# Patient Record
Sex: Female | Born: 1935 | Race: Black or African American | Hispanic: No
Health system: Southern US, Community
[De-identification: ages and names within clinical notes are randomized; demographics above are authoritative.]

## PROBLEM LIST (undated history)

## (undated) DIAGNOSIS — R55 Syncope and collapse: Secondary | ICD-10-CM

## (undated) DIAGNOSIS — Z8673 Personal history of transient ischemic attack (TIA), and cerebral infarction without residual deficits: Secondary | ICD-10-CM

## (undated) DIAGNOSIS — I10 Essential (primary) hypertension: Secondary | ICD-10-CM

## (undated) DIAGNOSIS — F0390 Unspecified dementia without behavioral disturbance: Secondary | ICD-10-CM

## (undated) DIAGNOSIS — E785 Hyperlipidemia, unspecified: Secondary | ICD-10-CM

## (undated) HISTORY — DX: Essential (primary) hypertension: I10

## (undated) HISTORY — PX: KNEE SURGERY: SHX244

## (undated) HISTORY — DX: Personal history of transient ischemic attack (TIA), and cerebral infarction without residual deficits: Z86.73

## (undated) HISTORY — PX: VAGINAL HYSTERECTOMY: SUR661

## (undated) HISTORY — DX: Syncope and collapse: R55

## (undated) HISTORY — PX: HALLUX VALGUS CORRECTION: SUR315

## (undated) HISTORY — DX: Unspecified dementia, unspecified severity, without behavioral disturbance, psychotic disturbance, mood disturbance, and anxiety: F03.90

## (undated) HISTORY — DX: Hyperlipidemia, unspecified: E78.5

---

## 2005-11-01 ENCOUNTER — Other Ambulatory Visit: Payer: Self-pay

## 2005-11-01 ENCOUNTER — Emergency Department: Payer: Self-pay | Admitting: Unknown Physician Specialty

## 2007-01-14 ENCOUNTER — Ambulatory Visit: Payer: Self-pay | Admitting: Internal Medicine

## 2007-04-13 ENCOUNTER — Other Ambulatory Visit: Payer: Self-pay

## 2007-04-13 ENCOUNTER — Inpatient Hospital Stay: Payer: Self-pay | Admitting: Internal Medicine

## 2007-09-16 ENCOUNTER — Ambulatory Visit: Payer: Self-pay | Admitting: Internal Medicine

## 2007-10-18 ENCOUNTER — Emergency Department: Payer: Self-pay | Admitting: Emergency Medicine

## 2008-02-26 ENCOUNTER — Ambulatory Visit: Payer: Self-pay | Admitting: Internal Medicine

## 2009-02-20 ENCOUNTER — Inpatient Hospital Stay: Payer: Self-pay | Admitting: Internal Medicine

## 2011-07-20 ENCOUNTER — Ambulatory Visit: Payer: Self-pay | Admitting: Podiatry

## 2011-07-27 ENCOUNTER — Ambulatory Visit: Payer: Self-pay | Admitting: Podiatry

## 2011-09-10 ENCOUNTER — Ambulatory Visit: Payer: Self-pay

## 2011-12-28 ENCOUNTER — Inpatient Hospital Stay: Payer: Self-pay | Admitting: Psychiatry

## 2011-12-28 LAB — DRUG SCREEN, URINE
Barbiturates, Ur Screen: NEGATIVE (ref ?–200)
Benzodiazepine, Ur Scrn: NEGATIVE (ref ?–200)
Cannabinoid 50 Ng, Ur ~~LOC~~: NEGATIVE (ref ?–50)
Cocaine Metabolite,Ur ~~LOC~~: NEGATIVE (ref ?–300)
Methadone, Ur Screen: NEGATIVE (ref ?–300)
Tricyclic, Ur Screen: NEGATIVE (ref ?–1000)

## 2011-12-28 LAB — COMPREHENSIVE METABOLIC PANEL
Albumin: 3.9 g/dL (ref 3.4–5.0)
Alkaline Phosphatase: 64 U/L (ref 50–136)
BUN: 28 mg/dL — ABNORMAL HIGH (ref 7–18)
Bilirubin,Total: 0.5 mg/dL (ref 0.2–1.0)
EGFR (African American): >60
Potassium: 4 mmol/L (ref 3.5–5.1)
SGPT (ALT): 21 U/L
Sodium: 143 mmol/L (ref 136–145)
Total Protein: 8.1 g/dL (ref 6.4–8.2)

## 2011-12-28 LAB — URINALYSIS, COMPLETE
Bacteria: NONE SEEN
Glucose,UR: NEGATIVE mg/dL (ref 0–75)
Leukocyte Esterase: NEGATIVE
Nitrite: NEGATIVE
Ph: 6 (ref 4.5–8.0)
Protein: NEGATIVE
Specific Gravity: 1.003 (ref 1.003–1.030)

## 2011-12-28 LAB — CBC
HCT: 40.1 % (ref 35.0–47.0)
HGB: 12.9 g/dL (ref 12.0–16.0)
MCH: 26.6 pg (ref 26.0–34.0)
MCHC: 32.3 g/dL (ref 32.0–36.0)
MCV: 82 fL (ref 80–100)
RDW: 14.4 % (ref 11.5–14.5)

## 2011-12-28 LAB — TSH: Thyroid Stimulating Horm: 1.52 u[IU]/mL

## 2012-10-18 ENCOUNTER — Observation Stay: Payer: Self-pay | Admitting: Internal Medicine

## 2012-10-18 LAB — CBC
HGB: 10.7 g/dL — ABNORMAL LOW (ref 12.0–16.0)
MCHC: 32.1 g/dL (ref 32.0–36.0)
Platelet: 109 10*3/uL — ABNORMAL LOW (ref 150–440)
RBC: 4.08 10*6/uL (ref 3.80–5.20)
RDW: 14.9 % — ABNORMAL HIGH (ref 11.5–14.5)

## 2012-10-18 LAB — URINALYSIS, COMPLETE
Bacteria: NONE SEEN
Bilirubin,UR: NEGATIVE
Glucose,UR: NEGATIVE mg/dL (ref 0–75)
Ketone: NEGATIVE
RBC,UR: 1 /HPF (ref 0–5)
Specific Gravity: 1.01 (ref 1.003–1.030)
Squamous Epithelial: 1
WBC UR: <1 /HPF (ref 0–5)

## 2012-10-18 LAB — COMPREHENSIVE METABOLIC PANEL
Alkaline Phosphatase: 70 U/L (ref 50–136)
Calcium, Total: 8.5 mg/dL (ref 8.5–10.1)
Co2: 28 mmol/L (ref 21–32)
EGFR (Non-African Amer.): 44 — ABNORMAL LOW
Osmolality: 289 (ref 275–301)
SGPT (ALT): 20 U/L (ref 12–78)

## 2012-10-18 LAB — CK TOTAL AND CKMB (NOT AT ARMC): CK-MB: 0.8 ng/mL (ref 0.5–3.6)

## 2012-10-18 LAB — TROPONIN I: Troponin-I: <0.02 ng/mL

## 2012-10-19 DIAGNOSIS — I10 Essential (primary) hypertension: Secondary | ICD-10-CM

## 2012-10-19 DIAGNOSIS — I369 Nonrheumatic tricuspid valve disorder, unspecified: Secondary | ICD-10-CM

## 2012-10-19 DIAGNOSIS — R55 Syncope and collapse: Secondary | ICD-10-CM

## 2012-10-19 LAB — CK TOTAL AND CKMB (NOT AT ARMC)
CK, Total: 47 U/L (ref 21–215)
CK, Total: 49 U/L (ref 21–215)
CK-MB: 1 ng/mL (ref 0.5–3.6)
CK-MB: 1 ng/mL (ref 0.5–3.6)

## 2012-10-19 LAB — CBC WITH DIFFERENTIAL/PLATELET
Basophil %: 0.8 %
Eosinophil #: 0.1 10*3/uL (ref 0.0–0.7)
Eosinophil %: 1.7 %
HGB: 10.7 g/dL — ABNORMAL LOW (ref 12.0–16.0)
Lymphocyte %: 49.2 %
MCHC: 31.6 g/dL — ABNORMAL LOW (ref 32.0–36.0)
Monocyte #: 0.3 x10 3/mm (ref 0.2–0.9)
Monocyte %: 8.4 %
Neutrophil #: 1.6 10*3/uL (ref 1.4–6.5)
Neutrophil %: 39.9 %
Platelet: 102 10*3/uL — ABNORMAL LOW (ref 150–440)
RBC: 4.1 10*6/uL (ref 3.80–5.20)

## 2012-10-19 LAB — COMPREHENSIVE METABOLIC PANEL
Albumin: 3.2 g/dL — ABNORMAL LOW (ref 3.4–5.0)
Alkaline Phosphatase: 63 U/L (ref 50–136)
Anion Gap: 6 — ABNORMAL LOW (ref 7–16)
Calcium, Total: 8.4 mg/dL — ABNORMAL LOW (ref 8.5–10.1)
Co2: 28 mmol/L (ref 21–32)
Creatinine: 1.02 mg/dL (ref 0.60–1.30)
EGFR (African American): 58 — ABNORMAL LOW
EGFR (Non-African Amer.): 50 — ABNORMAL LOW
Glucose: 72 mg/dL (ref 65–99)
Osmolality: 292 (ref 275–301)
SGOT(AST): 20 U/L (ref 15–37)
Sodium: 144 mmol/L (ref 136–145)
Total Protein: 6.4 g/dL (ref 6.4–8.2)

## 2012-10-19 LAB — TROPONIN I: Troponin-I: <0.02 ng/mL

## 2012-10-19 LAB — LACTATE DEHYDROGENASE: LDH: 168 U/L (ref 81–246)

## 2012-10-20 ENCOUNTER — Encounter: Payer: Self-pay | Admitting: *Deleted

## 2012-10-27 ENCOUNTER — Telehealth: Payer: Self-pay | Admitting: *Deleted

## 2012-10-27 ENCOUNTER — Encounter: Payer: Self-pay | Admitting: *Deleted

## 2012-10-27 NOTE — Telephone Encounter (Signed)
lmovm about upcoming appointment next Monday post hospital discharge. Mylo Red RN

## 2012-11-03 ENCOUNTER — Encounter: Payer: Self-pay | Admitting: Nurse Practitioner

## 2012-11-03 ENCOUNTER — Encounter: Payer: PRIVATE HEALTH INSURANCE | Admitting: Nurse Practitioner

## 2012-11-03 ENCOUNTER — Encounter: Payer: PRIVATE HEALTH INSURANCE | Admitting: Cardiovascular Disease

## 2012-11-07 ENCOUNTER — Ambulatory Visit: Payer: Self-pay | Admitting: Family Medicine

## 2013-07-08 ENCOUNTER — Emergency Department: Payer: Self-pay | Admitting: Emergency Medicine

## 2013-07-08 LAB — URINALYSIS, COMPLETE
Bilirubin,UR: NEGATIVE
Glucose,UR: NEGATIVE mg/dL (ref 0–75)
Leukocyte Esterase: NEGATIVE
Protein: NEGATIVE
RBC,UR: 3 /HPF (ref 0–5)
Squamous Epithelial: NONE SEEN
WBC UR: 1 /HPF (ref 0–5)

## 2013-07-08 LAB — TROPONIN I: Troponin-I: <0.02 ng/mL

## 2013-07-08 LAB — CBC
HCT: 36.9 % (ref 35.0–47.0)
HGB: 12.1 g/dL (ref 12.0–16.0)
MCH: 26.4 pg (ref 26.0–34.0)
MCHC: 32.9 g/dL (ref 32.0–36.0)
Platelet: 121 10*3/uL — ABNORMAL LOW (ref 150–440)
RBC: 4.59 10*6/uL (ref 3.80–5.20)
RDW: 14.8 % — ABNORMAL HIGH (ref 11.5–14.5)

## 2013-07-08 LAB — BASIC METABOLIC PANEL
Anion Gap: 6 — ABNORMAL LOW (ref 7–16)
Calcium, Total: 8.7 mg/dL (ref 8.5–10.1)
Co2: 27 mmol/L (ref 21–32)
EGFR (African American): >60
EGFR (Non-African Amer.): 55 — ABNORMAL LOW
Osmolality: 283 (ref 275–301)
Sodium: 142 mmol/L (ref 136–145)

## 2013-08-07 ENCOUNTER — Ambulatory Visit: Payer: Self-pay | Admitting: Family Medicine

## 2013-09-04 ENCOUNTER — Emergency Department: Payer: Self-pay | Admitting: Emergency Medicine

## 2013-09-04 LAB — COMPREHENSIVE METABOLIC PANEL
Alkaline Phosphatase: 90 U/L (ref 50–136)
Anion Gap: 4 — ABNORMAL LOW (ref 7–16)
BUN: 28 mg/dL — ABNORMAL HIGH (ref 7–18)
Chloride: 110 mmol/L — ABNORMAL HIGH (ref 98–107)
Co2: 26 mmol/L (ref 21–32)
Creatinine: 1.12 mg/dL (ref 0.60–1.30)
EGFR (Non-African Amer.): 44 — ABNORMAL LOW
Glucose: 94 mg/dL (ref 65–99)
Osmolality: 285 (ref 275–301)
SGOT(AST): 34 U/L (ref 15–37)
SGPT (ALT): 33 U/L (ref 12–78)
Sodium: 140 mmol/L (ref 136–145)
Total Protein: 6.6 g/dL (ref 6.4–8.2)

## 2013-09-04 LAB — DRUG SCREEN, URINE
Barbiturates, Ur Screen: NEGATIVE (ref ?–200)
Benzodiazepine, Ur Scrn: NEGATIVE (ref ?–200)
Cocaine Metabolite,Ur ~~LOC~~: NEGATIVE (ref ?–300)
MDMA (Ecstasy)Ur Screen: NEGATIVE (ref ?–500)
Opiate, Ur Screen: NEGATIVE (ref ?–300)
Phencyclidine (PCP) Ur S: NEGATIVE (ref ?–25)
Tricyclic, Ur Screen: NEGATIVE (ref ?–1000)

## 2013-09-04 LAB — URINALYSIS, COMPLETE
Glucose,UR: NEGATIVE mg/dL (ref 0–75)
Ketone: NEGATIVE
Nitrite: NEGATIVE
Ph: 7 (ref 4.5–8.0)

## 2013-09-04 LAB — CBC
HCT: 32.9 % — ABNORMAL LOW (ref 35.0–47.0)
HGB: 10.9 g/dL — ABNORMAL LOW (ref 12.0–16.0)
MCH: 26.8 pg (ref 26.0–34.0)
MCHC: 33 g/dL (ref 32.0–36.0)
MCV: 81 fL (ref 80–100)
Platelet: 128 10*3/uL — ABNORMAL LOW (ref 150–440)
RDW: 15.8 % — ABNORMAL HIGH (ref 11.5–14.5)
WBC: 3.7 10*3/uL (ref 3.6–11.0)

## 2013-09-04 LAB — ETHANOL
Ethanol %: <0.003 % (ref 0.000–0.080)
Ethanol: <3 mg/dL

## 2013-09-24 LAB — DRUG SCREEN, URINE
Cannabinoid 50 Ng, Ur ~~LOC~~: NEGATIVE (ref ?–50)
MDMA (Ecstasy)Ur Screen: NEGATIVE (ref ?–500)
Methadone, Ur Screen: NEGATIVE (ref ?–300)
Opiate, Ur Screen: NEGATIVE (ref ?–300)

## 2013-09-24 LAB — URINALYSIS, COMPLETE
Ketone: NEGATIVE
Nitrite: NEGATIVE
Ph: 5 (ref 4.5–8.0)
Protein: 30
RBC,UR: 264 /HPF (ref 0–5)
Specific Gravity: 1.02 (ref 1.003–1.030)
Squamous Epithelial: 1
Transitional Epi: <1
WBC UR: 562 /HPF (ref 0–5)

## 2013-09-24 LAB — COMPREHENSIVE METABOLIC PANEL
Albumin: 3.3 g/dL — ABNORMAL LOW (ref 3.4–5.0)
Alkaline Phosphatase: 91 U/L (ref 50–136)
Bilirubin,Total: 0.4 mg/dL (ref 0.2–1.0)
Calcium, Total: 8.4 mg/dL — ABNORMAL LOW (ref 8.5–10.1)
Co2: 26 mmol/L (ref 21–32)
Creatinine: 1.32 mg/dL — ABNORMAL HIGH (ref 0.60–1.30)
EGFR (African American): 42 — ABNORMAL LOW
EGFR (Non-African Amer.): 36 — ABNORMAL LOW
Glucose: 112 mg/dL — ABNORMAL HIGH (ref 65–99)
Potassium: 4.1 mmol/L (ref 3.5–5.1)
SGOT(AST): 23 U/L (ref 15–37)
SGPT (ALT): 21 U/L (ref 12–78)
Sodium: 141 mmol/L (ref 136–145)

## 2013-09-24 LAB — CBC
Platelet: 142 10*3/uL — ABNORMAL LOW (ref 150–440)
RBC: 3.86 10*6/uL (ref 3.80–5.20)
RDW: 15.3 % — ABNORMAL HIGH (ref 11.5–14.5)
WBC: 4.1 10*3/uL (ref 3.6–11.0)

## 2013-09-24 LAB — ETHANOL
Ethanol %: <0.003 % (ref 0.000–0.080)
Ethanol: <3 mg/dL

## 2013-09-24 LAB — SALICYLATE LEVEL: Salicylates, Serum: <1.7 mg/dL

## 2013-09-25 ENCOUNTER — Ambulatory Visit: Payer: Self-pay | Admitting: Hospice and Palliative Medicine

## 2013-09-25 ENCOUNTER — Inpatient Hospital Stay: Payer: Self-pay | Admitting: Internal Medicine

## 2013-09-27 LAB — BASIC METABOLIC PANEL
Anion Gap: 4 — ABNORMAL LOW (ref 7–16)
BUN: 24 mg/dL — ABNORMAL HIGH (ref 7–18)
Calcium, Total: 8.3 mg/dL — ABNORMAL LOW (ref 8.5–10.1)
Co2: 27 mmol/L (ref 21–32)
Glucose: 88 mg/dL (ref 65–99)
Osmolality: 287 (ref 275–301)
Sodium: 142 mmol/L (ref 136–145)

## 2013-10-05 ENCOUNTER — Ambulatory Visit: Payer: Self-pay | Admitting: Hospice and Palliative Medicine

## 2013-11-12 ENCOUNTER — Emergency Department: Payer: Self-pay | Admitting: Emergency Medicine

## 2013-11-12 LAB — CBC WITH DIFFERENTIAL/PLATELET
BASOS ABS: 0 10*3/uL (ref 0.0–0.1)
Basophil %: 0.7 %
EOS ABS: 0 10*3/uL (ref 0.0–0.7)
Eosinophil %: 0.3 %
HCT: 34.4 % — ABNORMAL LOW (ref 35.0–47.0)
HGB: 11.2 g/dL — AB (ref 12.0–16.0)
LYMPHS PCT: 15.9 %
Lymphocyte #: 0.9 10*3/uL — ABNORMAL LOW (ref 1.0–3.6)
MCH: 26.4 pg (ref 26.0–34.0)
MCHC: 32.4 g/dL (ref 32.0–36.0)
MCV: 81 fL (ref 80–100)
MONO ABS: 0.5 x10 3/mm (ref 0.2–0.9)
Monocyte %: 8.2 %
NEUTROS ABS: 4.2 10*3/uL (ref 1.4–6.5)
Neutrophil %: 74.9 %
Platelet: 131 10*3/uL — ABNORMAL LOW (ref 150–440)
RBC: 4.24 10*6/uL (ref 3.80–5.20)
RDW: 15.2 % — ABNORMAL HIGH (ref 11.5–14.5)
WBC: 5.6 10*3/uL (ref 3.6–11.0)

## 2013-11-12 LAB — URINALYSIS, COMPLETE
BACTERIA: NONE SEEN
BILIRUBIN, UR: NEGATIVE
Blood: NEGATIVE
Glucose,UR: NEGATIVE mg/dL (ref 0–75)
Hyaline Cast: 3
Ketone: NEGATIVE
Leukocyte Esterase: NEGATIVE
Nitrite: NEGATIVE
PH: 5 (ref 4.5–8.0)
PROTEIN: NEGATIVE
Specific Gravity: 1.026 (ref 1.003–1.030)
WBC UR: 1 /HPF (ref 0–5)

## 2013-11-12 LAB — BASIC METABOLIC PANEL
ANION GAP: 5 — AB (ref 7–16)
BUN: 32 mg/dL — AB (ref 7–18)
CALCIUM: 8.9 mg/dL (ref 8.5–10.1)
CO2: 24 mmol/L (ref 21–32)
CREATININE: 1.18 mg/dL (ref 0.60–1.30)
Chloride: 109 mmol/L — ABNORMAL HIGH (ref 98–107)
EGFR (African American): 48 — ABNORMAL LOW
GFR CALC NON AF AMER: 41 — AB
GLUCOSE: 98 mg/dL (ref 65–99)
OSMOLALITY: 283 (ref 275–301)
Potassium: 4.1 mmol/L (ref 3.5–5.1)
Sodium: 138 mmol/L (ref 136–145)

## 2014-03-03 ENCOUNTER — Inpatient Hospital Stay: Payer: Self-pay | Admitting: Internal Medicine

## 2014-03-03 LAB — CK-MB
CK-MB: 2.3 ng/mL (ref 0.5–3.6)
CK-MB: 4.7 ng/mL — AB (ref 0.5–3.6)
CK-MB: 4.9 ng/mL — AB (ref 0.5–3.6)

## 2014-03-03 LAB — URINALYSIS, COMPLETE
Bilirubin,UR: NEGATIVE
Glucose,UR: 50 mg/dL (ref 0–75)
Hyaline Cast: 1
Ketone: NEGATIVE
Nitrite: NEGATIVE
Ph: 6 (ref 4.5–8.0)
RBC,UR: 7 /HPF (ref 0–5)
Specific Gravity: 1.016 (ref 1.003–1.030)
Squamous Epithelial: 6

## 2014-03-03 LAB — TROPONIN I: Troponin-I: 0.66 ng/mL — ABNORMAL HIGH

## 2014-03-03 LAB — CBC
HCT: 33.9 % — AB (ref 35.0–47.0)
HGB: 10.5 g/dL — AB (ref 12.0–16.0)
MCH: 25.8 pg — AB (ref 26.0–34.0)
MCHC: 31 g/dL — AB (ref 32.0–36.0)
MCV: 83 fL (ref 80–100)
Platelet: 115 10*3/uL — ABNORMAL LOW (ref 150–440)
RBC: 4.08 10*6/uL (ref 3.80–5.20)
RDW: 15.8 % — AB (ref 11.5–14.5)
WBC: 5.8 10*3/uL (ref 3.6–11.0)

## 2014-03-03 LAB — COMPREHENSIVE METABOLIC PANEL
ALT: 86 U/L — AB (ref 12–78)
AST: 104 U/L — AB (ref 15–37)
Albumin: 3.1 g/dL — ABNORMAL LOW (ref 3.4–5.0)
Alkaline Phosphatase: 79 U/L
Anion Gap: 8 (ref 7–16)
BILIRUBIN TOTAL: 0.6 mg/dL (ref 0.2–1.0)
BUN: 60 mg/dL — AB (ref 7–18)
CALCIUM: 8.3 mg/dL — AB (ref 8.5–10.1)
Chloride: 111 mmol/L — ABNORMAL HIGH (ref 98–107)
Co2: 22 mmol/L (ref 21–32)
Creatinine: 2.17 mg/dL — ABNORMAL HIGH (ref 0.60–1.30)
EGFR (African American): 23 — ABNORMAL LOW
GFR CALC NON AF AMER: 20 — AB
Glucose: 136 mg/dL — ABNORMAL HIGH (ref 65–99)
Osmolality: 300 (ref 275–301)
POTASSIUM: 4.3 mmol/L (ref 3.5–5.1)
Sodium: 141 mmol/L (ref 136–145)
Total Protein: 6.8 g/dL (ref 6.4–8.2)

## 2014-03-04 DIAGNOSIS — R4182 Altered mental status, unspecified: Secondary | ICD-10-CM

## 2014-03-04 DIAGNOSIS — I959 Hypotension, unspecified: Secondary | ICD-10-CM

## 2014-03-04 DIAGNOSIS — I499 Cardiac arrhythmia, unspecified: Secondary | ICD-10-CM

## 2014-03-04 DIAGNOSIS — N179 Acute kidney failure, unspecified: Secondary | ICD-10-CM

## 2014-03-04 LAB — CBC WITH DIFFERENTIAL/PLATELET
BASOS ABS: 0 10*3/uL (ref 0.0–0.1)
BASOS PCT: 0.2 %
Eosinophil #: 0 10*3/uL (ref 0.0–0.7)
Eosinophil %: 0.1 %
HCT: 33.3 % — ABNORMAL LOW (ref 35.0–47.0)
HGB: 10.2 g/dL — ABNORMAL LOW (ref 12.0–16.0)
LYMPHS PCT: 17.8 %
Lymphocyte #: 1.1 10*3/uL (ref 1.0–3.6)
MCH: 25.7 pg — ABNORMAL LOW (ref 26.0–34.0)
MCHC: 30.6 g/dL — AB (ref 32.0–36.0)
MCV: 84 fL (ref 80–100)
MONOS PCT: 8.3 %
Monocyte #: 0.5 x10 3/mm (ref 0.2–0.9)
Neutrophil #: 4.5 10*3/uL (ref 1.4–6.5)
Neutrophil %: 73.6 %
Platelet: 86 10*3/uL — ABNORMAL LOW (ref 150–440)
RBC: 3.95 10*6/uL (ref 3.80–5.20)
RDW: 16 % — ABNORMAL HIGH (ref 11.5–14.5)
WBC: 6.1 10*3/uL (ref 3.6–11.0)

## 2014-03-04 LAB — BASIC METABOLIC PANEL
Anion Gap: 11 (ref 7–16)
Anion Gap: 11 (ref 7–16)
BUN: 65 mg/dL — AB (ref 7–18)
BUN: 64 mg/dL — AB (ref 7–18)
CALCIUM: 7.4 mg/dL — AB (ref 8.5–10.1)
CALCIUM: 7.7 mg/dL — AB (ref 8.5–10.1)
CO2: 16 mmol/L — AB (ref 21–32)
CREATININE: 2.43 mg/dL — AB (ref 0.60–1.30)
Chloride: 116 mmol/L — ABNORMAL HIGH (ref 98–107)
Chloride: 116 mmol/L — ABNORMAL HIGH (ref 98–107)
Co2: 18 mmol/L — ABNORMAL LOW (ref 21–32)
Creatinine: 2.12 mg/dL — ABNORMAL HIGH (ref 0.60–1.30)
EGFR (African American): 23 — ABNORMAL LOW
EGFR (Non-African Amer.): 20 — ABNORMAL LOW
GFR CALC AF AMER: 20 — AB
GFR CALC NON AF AMER: 17 — AB
GLUCOSE: 131 mg/dL — AB (ref 65–99)
Glucose: 108 mg/dL — ABNORMAL HIGH (ref 65–99)
OSMOLALITY: 305 (ref 275–301)
OSMOLALITY: 308 (ref 275–301)
Potassium: 5.2 mmol/L — ABNORMAL HIGH (ref 3.5–5.1)
Potassium: 4.5 mmol/L (ref 3.5–5.1)
SODIUM: 145 mmol/L (ref 136–145)
Sodium: 143 mmol/L (ref 136–145)

## 2014-03-04 LAB — TROPONIN I
Troponin-I: 1.7 ng/mL — ABNORMAL HIGH
Troponin-I: 1.6 ng/mL — ABNORMAL HIGH

## 2014-03-05 DIAGNOSIS — I359 Nonrheumatic aortic valve disorder, unspecified: Secondary | ICD-10-CM

## 2014-03-05 DIAGNOSIS — N179 Acute kidney failure, unspecified: Secondary | ICD-10-CM

## 2014-03-05 DIAGNOSIS — R4182 Altered mental status, unspecified: Secondary | ICD-10-CM

## 2014-03-05 DIAGNOSIS — I959 Hypotension, unspecified: Secondary | ICD-10-CM

## 2014-03-05 DIAGNOSIS — I499 Cardiac arrhythmia, unspecified: Secondary | ICD-10-CM

## 2014-03-05 LAB — BASIC METABOLIC PANEL
ANION GAP: 8 (ref 7–16)
BUN: 59 mg/dL — ABNORMAL HIGH (ref 7–18)
CALCIUM: 7.6 mg/dL — AB (ref 8.5–10.1)
CHLORIDE: 119 mmol/L — AB (ref 98–107)
CO2: 19 mmol/L — AB (ref 21–32)
Creatinine: 1.43 mg/dL — ABNORMAL HIGH (ref 0.60–1.30)
EGFR (African American): 38 — ABNORMAL LOW
EGFR (Non-African Amer.): 33 — ABNORMAL LOW
Glucose: 129 mg/dL — ABNORMAL HIGH (ref 65–99)
OSMOLALITY: 309 (ref 275–301)
Potassium: 4 mmol/L (ref 3.5–5.1)
Sodium: 146 mmol/L — ABNORMAL HIGH (ref 136–145)

## 2014-03-05 LAB — URINE CULTURE

## 2014-03-05 LAB — TSH: THYROID STIMULATING HORM: 0.475 u[IU]/mL

## 2014-03-05 LAB — PLATELET COUNT: PLATELETS: 88 10*3/uL — AB (ref 150–440)

## 2014-03-06 LAB — BASIC METABOLIC PANEL
Anion Gap: 6 — ABNORMAL LOW (ref 7–16)
BUN: 48 mg/dL — ABNORMAL HIGH (ref 7–18)
CALCIUM: 7.8 mg/dL — AB (ref 8.5–10.1)
CHLORIDE: 121 mmol/L — AB (ref 98–107)
CO2: 21 mmol/L (ref 21–32)
CREATININE: 0.89 mg/dL (ref 0.60–1.30)
EGFR (African American): >60
GFR CALC NON AF AMER: 58 — AB
GLUCOSE: 109 mg/dL — AB (ref 65–99)
Osmolality: 307 (ref 275–301)
POTASSIUM: 4.1 mmol/L (ref 3.5–5.1)
SODIUM: 148 mmol/L — AB (ref 136–145)
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
## 2015-02-22 NOTE — H&P (Signed)
PATIENT NAME:  Krystal Chavez, Krystal Chavez MR#:  [id number] DATE OF BIRTH:  1924-01-27  DATE OF ADMISSION:  10/18/2012  REFERRING PHYSICIAN: Dr. Thomasene Lot.   PRIMARY CARE PHYSICIAN: Dr. Patsey Berthold of the [facility] senior care.   CHIEF COMPLAINT: "I passed out."   HISTORY OF PRESENT ILLNESS: The patient is a very pleasant 81 year old African American female with past medical history of mild dementia, hypertension, TIA, hyperlipidemia, recent psychiatric admission for behavioral disturbance, who presented to [facility] after experiencing a syncopal event. The patient offered history regarding this event. The patient states that she woke up at, what she thought was, early morning. She discovered that it was close to 1:00. She went to the kitchen to make breakfast. She was concerned that she had slept so long and therefore she called a friend. The patient's friend stayed for a short period of time, but after not finding anything seriously wrong with her, the patient's friend left. The patient was sitting at the breakfast table after her friend left. While sitting up, she experienced lightheadedness and dizziness. She subsequently passed out and fell onto the floor. She injured the right side of her lip. She called her friend back and once her friend arrived she called EMT. She was subsequently brought here for evaluation. CT of the head was performed, which showed atrophy with chronic microvascular ischemic disease. The patient also had a CT maxillofacial study, which showed no acute bony abnormality. First set of troponin is negative. The initial EKG performed at 1605 showed sinus bradycardia with a first-degree AV block. The patient had another EKG performed at 1612, which showed a heart rate of 112. Currently, the patient's heart rate is back down. On laboratory studies, the patient has an elevated BUN of 36, which is a bit higher than her normal baseline. She is noted to be on  Diovan/hydrochlorothiazide. She denies decreased p.o. fluid intake at home, however. She reports that she has had prior syncopal events. In addition, she has a mild leukopenia, anemia, and thrombocytopenia.   PAST MEDICAL HISTORY:  1.  Dementia either of the Alzheimer's versus vascular type.  2.  Hypertension.  3.  History of TIA.  4.  Prior syncopal events.  5.  Hyperlipidemia.  6.  History of behavioral disturbance requiring psychiatric admission in February 2013 managed by Dr. Weber Cooks.   PAST SURGICAL HISTORY:  1.  Hysterectomy.  2.  Left knee surgery.  3.  Hallux valgus correction of the left foot.   ALLERGIES: No known drug allergies.   HOME MEDICATIONS: Include: 1.  Caduet 2.5/10 mg 1 tablet p.o. daily. 2.  Diovan/hydrochlorothiazide 320/25, 1 tablet p.o. daily.  3.  Plavix 75 mg p.o. daily.  4.  Quetiapine 25 mg p.o. daily.  5.  Toprol-XL 50 mg p.o. daily.  6.  Fiber-Lax 625 mg p.o. daily. 7.  Pantoprazole 40 mg p.o. daily.  8.  Multivitamin with Lutein oral tablet p.o. daily.  9.  Aspirin 81 mg daily.   SOCIAL HISTORY: The patient lives in [place]. She also goes to the [facility]. She is a widow. She has two adopted children. She denies tobacco, alcohol, or illicit drug use.   FAMILY HISTORY: Mother died at age 38 apparently from old age per the patient's report. The patient's father died when the patient was very young secondary to complications from an animal bite infection.   REVIEW OF SYSTEMS: CONSTITUTIONAL: Denies fevers, chills, weight loss.  EYES: Denies diplopia or blurry vision.  HEENT:  Denies headaches, hearing loss, tinnitus, epistaxis, sore throat.  CARDIOVASCULAR: Denies chest pains or palpitations. Did have syncope today.  RESPIRATORY: Denies cough, shortness of breath or hemoptysis.  GI: Denies nausea, vomiting, dysgeusia.  GENITOURINARY: Denies frequency, urgency or dysuria.  MUSCULOSKELETAL: Denies joint pain, swelling or redness.   INTEGUMENTARY: Denies skin rashes or lesions.  NEUROLOGIC: Had syncopal event today. Denies focal extremity numbness or weakness.  PSYCHIATRIC: Had a behavioral disturbance requiring psychiatric admission in February 2013.  ENDOCRINE: Denies polyuria, polydipsia, or polyphagia.  HEMATOLOGIC/LYMPHATIC: Denies easy bruisability, bleeding, or swollen lymph nodes. ALLERGY/IMMUNOLOGIC: Denies seasonal allergies or history of immunodeficiency.   PHYSICAL EXAMINATION:  VITAL SIGNS: Temperature 97.6, pulse 60, respirations 20, blood pressure 156/76, pulse ox 98%.  GENERAL: Well developed, well nourished [place] American female, who appears her stated age, currently in no acute distress.  HEENT: Normocephalic, atraumatic. Extraocular movements are intact. Pupils equal, round, and reactive to light. No scleral icterus. Conjunctivae are pink. No epistaxis noted. Gross hearing intact. The right upper lip is swollen.  NECK: Supple and without JVD or lymphadenopathy.  LUNGS: Clear to auscultation bilaterally with normal respiratory effort.  CARDIOVASCULAR: S1, S2 regular rate and rhythm. 2/6 systolic ejection murmur heard.  ABDOMEN: Soft, nontender, nondistended. Bowel sounds positive. No rebound or guarding. No gross organomegaly appreciated.  EXTREMITIES: No clubbing, cyanosis or edema.  NEUROLOGIC: The patient is alert and oriented to time, person and place. Strength is 5 out of 5 in both upper and lower extremities. Gross sensation is intact.  MUSCULOSKELETAL: No joint redness, swelling or tenderness appreciated.  SKIN: Warm and dry. No rashes noted.  PSYCHIATRIC: The patient with an appropriate affect and appears to have excellent insight into her current illness.   LABORATORY DATA: Right hip x-ray shows degenerative change. CT head without contrast shows atrophy with chronic microvessel ischemic disease. There is  also chronic partial opacification of the right mastoid tip. CT maxillofacial area shows  no acute bony abnormality. Chest x-ray showed mild cardiomegaly, which is stable. Troponin less than 0.02. CBC shows WBC 3.4, hemoglobin 10.7, hematocrit 33.5, and platelets 109. CMP shows sodium 141, potassium 4, chloride 106, CO2 28, BUN 36, creatinine 1.1, glucose 84. eGFR 51. First EKG at 1605 today shows sinus bradycardia with first degree AV block. Second EKG showed a heart rate of 112. There was suggestion of atrial enlargement.   IMPRESSION: This is an 81 year old African American female with past medical history of dementia, hypertension, transient ischemic attack, hyperlipidemia, hysterectomy, history of left knee surgery, history of recurrent syncopal events, who presented to [facility] with a syncopal event. 1.  Syncope: Etiology of this is unclear; however, cardiac causes need to be further explored. We will admit the patient under observation to telemetry. We will check another 2D echocardiogram as the patient continues to have a murmur. We will also check carotid duplex and we will also check TSH. We will obtain cardiology consultation since the patient's syncope has been recurrent in nature. She denied having palpitations or chest pain; however, we will check cardiac enzymes as well.  2.  Dehydration: BUN above baseline. BUN is currently 36. We will provide the patient with intravenous fluid hydration with normal saline at 70 mL per hour. For now, we will continue Diovan/hydrochlorothiazide; however, if renal function worsens, we may need to consider holding this temporarily.  3.  Leukopenia/anemia/thrombocytopenia: Etiologies of this is currently unclear. We will check serum and urine protein electrophoresis. We will also obtain a hematology consultation  for this.  4.  Hypertension. We will continue the patient's home doses of Diovan/hydrochlorothiazide, Toprol-XL and Caduet.  5.  Healthcare power of attorney: Gunnar Fusi. 6.  CODE STATUS: The patient has limited  CODE STATUS. She does not want intubation, but is open to cardiac compressions, medications needed for cardiopulmonary resuscitation and electrical shocks.  7.  Time spent: One hour.   ____________________________ Tama High, MD mnl:aw D: 10/18/2012 20:01:31 ET T: 10/19/2012 10:46:47 ET JOB#: [id number]  cc: Tama High, MD, <Dictator> Mariah Milling Jonna Dittrich MD ELECTRONICALLY SIGNED 10/29/2012 13:50

## 2015-02-22 NOTE — Consult Note (Signed)
Brief Consult Note: Diagnosis: SYNCOPE, POSSIBLE TACHY/BRADY SYNDROME, CVA/R LEG WEAKNESS, NEW MILD ANEMIA AND THROMBOCYTOPENIA.   Patient was seen by consultant.   Consult note dictated.   Discussed with Attending MD.   Comments: SEE DICTATED NOTE. ANEMIA AND THROMBOCYTOPENIA ARE NEW SINCE FEB, COULD BE EARLY MDS, AGREE TO R/O MYELOMA, DIFF DX INCLUDES IDA, B12,  DOUBT BUT ALWAYS POSSIBLE PRIOR EXPOSURE HIV OR HEP C,  MEDICATIONS INCLUDING PLAVIX AND PROTON PUMP INHIBITORS CAN CAUSE THROMBOCYTOPENIA. HYPERSPLENISM CAN CAUSE, SPLEEN NORMAL SIZE ON CLINICAL EXAM  SPEP ALREADY DONE, IF ABNORMAL WOULD CHECK SIEP, OTHERWISE UIEP AND ADDITIONAL STUDIES ORDERED FOR TOMORROW. EXAM BENIGN.  Electronic Signatures: Marin RobertsGittin, Jewelene Mairena G (MD)  (Signed 15-Dec-13 11:17)  Authored: Brief Consult Note   Last Updated: 15-Dec-13 11:17 by Marin RobertsGittin, Kameo Bains G (MD)

## 2015-02-22 NOTE — Discharge Summary (Signed)
PATIENT NAME:  Krystal Chavez, [name] MR#:  [id number] DATE OF BIRTH:  04/30/1924  DATE OF ADMISSION:  10/18/2012 DATE OF DISCHARGE:  10/20/2012  PRIMARY CARE PHYSICIAN: Dr. Alonna BucklerAndrew Lamb.   DISCHARGE DIAGNOSES:  1.  Sinus bradycardia.  2.  Syncope. 3.  Dehydration.  4.  Possible MDS.   CONSULTS: Dr. Mariah MillingGollan of cardiology and Dr. Lorre NickGittin of hematology.   IMAGING STUDIES DONE: Include: 1.  CT scan of the head without contrast, which showed no acute intracranial abnormalities.  2.  CT maxillofacial area showed no acute bony abnormalities.  3.  MRI of the brain showed chronic changes with some atrophy, age-related, and chronic mastoiditis. No CVA.  4.  Ultrasound of the carotid Doppler's showed atherosclerotic disease, but no hemodynamically significant stenosis.   ADMITTING HISTORY AND PHYSICAL: Please see detailed history and physical dictated on 10/18/2012. In brief, an 81 year old African American female patient with past history of dementia, hypertension, transient ischemic attacks and syncope who presented to the Emergency Room after having a syncopal episode. The patient passed out for a few seconds. The patient was admitted to the hospitalist service for further work-up and treatment. The patient had an echocardiogram done, which showed some mild aortic stenosis. No significant valvular abnormalities. MRI of the brain was found to have no acute abnormalities along with carotid Doppler showing no significant stenosis. The patient was found to have sinus bradycardia, which is thought to be the reason for her syncope. Her metoprolol has been stopped. For her hypertension, the patient has been increased on amlodipine dose. Hydrochlorothiazide was stopped secondary to dehydration _____ syncope.   The patient was also found to have possible myelodysplastic syndrome. Dr. Lorre NickGittin of hematology has seen the patient, who has suggested no further treatment or work-up at this time and monitoring of blood counts.  The patient can follow up with cancer center if needed.   The patient did not have any arrhythmias on tele. She will follow-up with Dr. Mariah MillingGollan of cardiology in 1 week. This plan was discussed with the patient's daughter, who has verbalized understanding and is okay with the plan.   On the day of discharge, the patient's pulse is 65, blood pressure 156/71, saturating 99% on room air with no further syncopal episodes and normal neurological examination and is being discharged home in fair condition.   DISCHARGE MEDICATIONS: Include: 1.  Aspirin 81 mg oral once a day.  2.  Plavix 75 mg oral once a day.  3.  Protonix 40 mg oral once a day.  4.  Seroquel 25 mg oral once a day at bedtime.  5.  Losartan 100 mg oral once a day.  6.  Caduet 10 mg/10 mg oral once a day.   DISCHARGE INSTRUCTIONS: Continue with a low-salt diet. Activity as tolerated.  The patient will continue with physical therapy at home. Follow up with primary care physician in a week and Dr. Mariah MillingGollan of cardiology in a week.   TIME SPENT: Time spent today on this discharge dictation along with coordinating and counseling of the patient was 40 minutes.    ____________________________ Molinda BailiffSrikar R. Jaydian Santana, MD srs:aw D: 10/20/2012 15:33:57 ET T: 10/21/2012 14:17:43 ET JOB#: [id number]  cc: Wardell HeathSrikar R. Lurleen Soltero, MD, <Dictator> Orie FishermanSRIKAR R Barri Neidlinger MD ELECTRONICALLY SIGNED 10/21/2012 17:10

## 2015-02-22 NOTE — Consult Note (Signed)
General Aspect 81 year old [place] American female with past medical history of mild dementia, hypertension, TIA, hyperlipidemia, recent psychiatric admission for behavioral disturbance, who presented after experiencing a syncopal event (per the patient). cardiology was consulted for syncope.   The patient states that she woke up at close to 1:00 pm yesterday. She went to the kitchen to make breakfast, called a friend concerned that she had slept late.  The patient's friend did not finding anything seriously wrong with her, and left. The patient was sitting at the breakfast table and she experienced lightheadedness and dizziness, subsequently passed out and fell onto the floor. She injured the right side of her lip. She called her friend back and once her friend arrived she called EMT. She reports that she has had prior syncopal events.  In teh ER, CT of the head showed atrophy with chronic microvascular ischemic disease.  CT maxillofacial study showed no acute bony abnormality. First set of troponin is negative.   On laboratory studies, the patient has an elevated BUN of 36,  She is noted to be on Diovan/hydrochlorothiazide.  She denies decreased p.o. fluid intake at home.    Present Illness . SOCIAL HISTORY: The patient lives in [place]. She also goes to the [facility]. She is a widow. She has two adopted children. She denies tobacco, alcohol, or illicit drug use.   FAMILY HISTORY: Mother died at age 4 apparently from old age per the patient???s report. The patient???s father died when the patient was very young secondary to complications from an animal bite infection.   Physical Exam:   GEN well developed, well nourished, no acute distress    HEENT red conjunctivae    NECK supple    RESP normal resp effort  clear BS    CARD Regular rate and rhythm  Murmur    Murmur Systolic    Systolic Murmur Out flow    ABD denies tenderness  soft    LYMPH negative neck     EXTR negative edema    SKIN normal to palpation    NEURO motor/sensory function intact    PSYCH alert, A+O to time, place, person, good insight   Review of Systems:   Subjective/Chief Complaint lightheadedness, then syncope    General: No Complaints    Skin: No Complaints    ENT: No Complaints    Eyes: No Complaints    Neck: No Complaints    Respiratory: No Complaints    Cardiovascular: No Complaints    Gastrointestinal: No Complaints    Genitourinary: No Complaints    Vascular: No Complaints    Musculoskeletal: No Complaints    Neurologic: Dizzness  Fainting    Hematologic: No Complaints    Endocrine: No Complaints    Psychiatric: No Complaints    Review of Systems: All other systems were reviewed and found to be negative    Medications/Allergies Reviewed Medications/Allergies reviewed     Left eye blindness:    Swallowing difficulty:    HOH:    Reflux:    Dementia:    Degenerative Disc Disease:    renal insufficiency:    anemia:    tia:    H/O stroke/ Right sided weakness:    Hypertension:    Hysterectomy:    Appendectomy:    Knee surgery:        Admit Diagnosis:   SYNCOPE AND COLLAPSE: 19-Oct-2012, Active, SYNCOPE AND COLLAPSE      Admit Reason:   Syncope and collapse: (  780.2) 18-Oct-2012, Active, ICD9, Syncope and collapse  Home Medications: Medication Instructions Status  aspirin 81 mg oral tablet tab(s)  once a day (in the evening) Active  Toprol-XL 50 mg oral tablet, extended release 1 tab(s) orally once a day (in the morning) Active  pantoprazole 40 mg oral enteric coated tablet 1 tab(s) orally once a day (at bedtime) Active  Plavix 75 mg oral tablet 1 tab(s) orally once a day (in the morning) Active  Caduet 2.5 mg-10 mg oral tablet 1 tab(s) orally once a day (in the morning) Active  Diovan HCT 320 mg-25 mg oral tablet 1 tab(s) orally once a day Active  Fiber Lax 625 mg oral tablet 1 tab(s) orally once a day (in  the morning) Active  CertaVite with Lutein oral tablet 1 tab(s) orally once a day Active  quetiapine 25 mg oral tablet 1 tab(s) orally once a day (at bedtime) Active   Lab Results:  Hepatic:  15-Dec-13 01:02    Bilirubin, Total 0.6   Alkaline Phosphatase 63   SGPT (ALT) 18   SGOT (AST) 20   Total Protein, Serum 6.4   Albumin, Serum  3.2  Routine Chem:  15-Dec-13 01:02    Glucose, Serum 72   BUN  32   Creatinine (comp) 1.02   Sodium, Serum 144   Potassium, Serum 3.9   Chloride, Serum  110   CO2, Serum 28   Calcium (Total), Serum  8.4   Osmolality (calc) 292   eGFR (African American)  58   eGFR (Non-African American)  50 (eGFR values <18m/min/1.73 m2 may be an indication of chronic kidney disease (CKD). Calculated eGFR is useful in patients with stable renal function. The eGFR calculation will not be reliable in acutely ill patients when serum creatinine is changing rapidly. It is not useful in  patients on dialysis. The eGFR calculation may not be applicable to patients at the low and high extremes of body sizes, pregnant women, and vegetarians.)   Anion Gap  6  Cardiac:  15-Dec-13 01:02    CK, Total 47   CPK-MB, Serum 1.0 (Result(s) reported on 19 Oct 2012 at 01:50AM.)   Troponin I < 0.02 (0.00-0.05 0.05 ng/mL or less: NEGATIVE  Repeat testing in 3-6 hrs  if clinically indicated. >0.05 ng/mL: POTENTIAL  MYOCARDIAL INJURY. Repeat  testing in 3-6 hrs if  clinically indicated. NOTE: An increase or decrease  of 30% or more on serial  testing suggests a  clinically important change)  Routine Hem:  15-Dec-13 01:02    WBC (CBC) 4.1   RBC (CBC) 4.10   Hemoglobin (CBC)  10.7   Hematocrit (CBC)  33.8   Platelet Count (CBC)  102   MCV 82   MCH 26.1   MCHC  31.6   RDW  15.2   Neutrophil % 39.9   Lymphocyte % 49.2   Monocyte % 8.4   Eosinophil % 1.7   Basophil % 0.8   Neutrophil # 1.6   Lymphocyte # 2.0   Monocyte # 0.3   Eosinophil # 0.1   Basophil # 0.0  (Result(s) reported on 19 Oct 2012 at 01:35AM.)   EKG:   Interpretation EKG shows sinus bradycardia rate 57 bpm, 1 deg AV block Repeat EKG shows sinus tachycardia rate 112, suspect lead reversal    [place]ach: Rash  NKDA: None  Vital Signs/Nurse's Notes: **Vital Signs.:   15-Dec-13 08:13   Vital Signs Type Routine   Temperature Temperature (F) 97.9   Celsius  36.6   Temperature Source oral   Pulse Pulse 52   Pulse source if not from Vital Sign Device per Telemetry Clerk   Systolic BP Systolic BP 597   Diastolic BP (mmHg) Diastolic BP (mmHg) 75   Mean BP 103   Pulse Ox % Pulse Ox % 98   Pulse Ox Activity Level  At rest   Oxygen Delivery Room Air/ 21 %     Impression 81 year old [place] American female with past medical history of mild dementia, hypertension, TIA, hyperlipidemia, recent psychiatric admission for behavioral disturbance, who presented after experiencing a syncopal event. cardiology was consulted for syncope.   1) Syncope: BUN elevated on arrival (on HCTZ) Heart rate low on tele at rest, low 50s (51) First true episode of syncope per the patient. no recent illnesses, GI issues. --echo pending --Would hold HCTZ given climb in BUN --Hold metoprolol for now given rate of 50 --Continue to monitor on Tele  2) HTN: Continue ARB, change to losartan 100 mg daily (hold HCTZ)(hold diovan) Hold metoprolol Increase amlodipine to 5 mg bid with hold parameters, split from lipitor 10 (cheaper) If needed, could start hydralazine for HTN  3)Psych: recent psychosis, seem appropriate on this admission.   Electronic Signatures: Ida Rogue (MD)  (Signed 15-Dec-13 13:57)  Authored: General Aspect/Present Illness, History and Physical Exam, Review of System, Past Medical History, Health Issues, Home Medications, Labs, EKG , Allergies, Vital Signs/Nurse's Notes, Impression/Plan   Last Updated: 15-Dec-13 13:57 by Ida Rogue (MD)
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
## 2015-02-25 NOTE — Consult Note (Signed)
   Comments   Received a call back from pt's daughter. Note that she was not legally adopted by patient but raised by her and considers patient to be her mother. She says she is in the process of trying to obtain legal guardianship for patient but has been making all of pt's medical decisions. She tells me that at baseline, pt is still ambulatory, able to talk, and recognizes her. She is concerned about pt's agitation as she has been informed by the nursing home that patient may not be able to return unless this is better controlled.  talked about code status. Daughter says that pt told her to attempt resuscitation "unless I am brain dead". She feels she should honor pt's wish. I did explain that resuscitation at her age may be futile particularly given progressive dementia. She says she will continue talking about this with PACE team and may opt for less aggressive measures as pt's dementia progresses.  15 minutes  Electronic Signatures: BordersDaryl Eastern, Ashla Murph R (NP)  (Signed 21-Nov-14 15:43)  Authored: Palliative Care   Last Updated: 21-Nov-14 15:43 by Malachy MoanBorders, Geena Weinhold R (NP)

## 2015-02-25 NOTE — Consult Note (Signed)
Brief Consult Note: Diagnosis: Cognitive disorder NOS, Delirium secondary to medical condition.   Patient was seen by consultant.   Consult note dictated.   Recommend further assessment or treatment.   Orders entered.   Comments: Ms. Chalmers Guestlston is a resident of a nursing home with reportedly littly cognitive difficutlies. She became confused, agitated and aggressive. This is likely doe to delirium. There is no history of mental illness. We are still awaiting UA.  PLAN: 1. I restarted all her medications as in the community.   2. Low dose haldol was added for delirium.  3. Trazodone 50 mg tid was started for aggressive behavior.   4. I will follow along.  ADDENDUM: 1. UA negative.  2. If the patient requires Geropsychiatry admission, we'll need chest Rx and EKG, please..  Electronic Signatures: Kristine LineaPucilowska, Aleksia Freiman (MD)  (Signed 31-Oct-14 14:24)  Authored: Brief Consult Note   Last Updated: 31-Oct-14 14:24 by Kristine LineaPucilowska, Larrisa Cravey (MD)
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
## 2015-02-25 NOTE — Consult Note (Signed)
Brief Consult Note: Diagnosis: delirium due to medical illness on top of dementia.   Patient was seen by consultant.   Consult note dictated.   Orders entered.   Comments: Psychiatry: PAtient seen. Currently non-communicative. Reviewed chart and labs. PAtient with hx dementia with psychosis is more confused and agitated. Continue current meds including prn haldol.  Electronic Signatures: Audery Amellapacs, Gregrey Bloyd T (MD)  (Signed 21-Nov-14 14:30)  Authored: Brief Consult Note   Last Updated: 21-Nov-14 14:30 by Audery Amellapacs, Bertie Mcconathy T (MD)

## 2015-02-25 NOTE — Discharge Summary (Signed)
PATIENT NAME:  Krystal Chavez, Krystal Chavez MR#:  [id number] DATE OF BIRTH:  01-02-1924  DATE OF ADMISSION:  09/25/2013 DATE OF DISCHARGE:  09/28/2013 [facility]  PRESENTING COMPLAINT: Agitation and dehydration.   DISCHARGE DIAGNOSES:  1.  Acute renal failure due to dehydration and urinary tract infection, improved.  2.  Dementia, agitation and psychosis, precipitated by dehydration and urinary tract infection, now appears to be at baseline stable.  3.  Hypertension.  4.  History of transient ischemic attack.  5.  Chronic obstructive pulmonary disease, stable.   CODE STATUS: Full code.   DIET: Regular diet.   DISCHARGE MEDICATIONS:   1.  Plavix 75 mg daily.  2.  Remeron 7.5 mg at bedtime.  3.  Zyprexa 10 mg at bedtime.  4.  Restoril 7.5 mg at bedtime p.r.n.  5.  Caduet 10/10 mg daily.  6.  Protonix 40 mg daily.  7.  Drisdol 50,000 international units monthly.  8.  Zyprexa 5 mg daily.  9.  Mylanta 80 mg orally q.6 hours p.r.n.  10. Diovan 320 mg p.o. daily.  11. Keflex 500 mg q.8 hourly for 4 more days.   PSYCHIATRY CONSULTATION: With Dr. Toni Amendlapacs.   LABS: Glucose is 88, BUN 24, creatinine 1.03, sodium 142, potassium is 4. Urine drug screen negative. UA positive for UTI. Urine culture not sent via ER. White count is 4.1. Creatinine on admission was 132.   BRIEF SUMMARY OF HOSPITAL COURSE: Krystal BoeckJohnnie Chavez is 81 year old African American female with history of dementia with agitation and psychosis, who was discharged from [facility] 2 days ago, comes into the Emergency Room with:  1.  Acute renal failure, suspected due to dehydration from urinary tract infection. She was started on IV fluids. Creatinine back to baseline. She is eating and drinking well. She was started on Rocephin and symptoms improved. Urine culture not sent by Emergency Room. Since the patient is afebrile and white count is stable, we will change to p.o. Keflex to complete a 7-day course.  2.  Dementia with  agitation and psychosis. [facility]. She was continued on Remeron 7.5 mg along with her Zyprexa. Seen by psych. No new recommendations given. The patient is very calm and stable at this time.  3.  Hypertension. Continue Caduet and Diovan.  4.  History of TIA. Continue atorvastatin and Plavix.  5.  COPD. Continue Spiriva and Ventolin. No acute exacerbation noted. Hospital stay otherwise remained stable.   CODE STATUS: The patient remained a full code.   TIME SPENT: 40 minutes.  ____________________________ Wylie HailSona A. Allena KatzPatel, MD sap:aw D: 09/28/2013 11:50:39 ET T: 09/28/2013 13:52:16 ET JOB#: [id number]  cc: Dequante Tremaine A. Allena KatzPatel, MD, <Dictator> Kandyce RudMarcus Babaoff, MD Willow OraSONA A Randalyn Ahmed MD ELECTRONICALLY SIGNED 09/28/2013 14:18

## 2015-02-25 NOTE — Consult Note (Signed)
PATIENT NAME:  Krystal Chavez, Krystal Chavez MR#:  [id number] DATE OF BIRTH:  Mar 28, 1924  DATE OF CONSULTATION:  10/19/2012  REFERRING PHYSICIAN:   CONSULTING PHYSICIAN:  Simonne Come. Teliyah Royal, MD  HISTORY OF PRESENT ILLNESS: The patient is an 81 year old patient, who was admitted yesterday with syncope and possible tachybrady syndrome. She had slight dehydration. She was admitted to the medical service after labs and given intravenous fluids. She suffered a fall to her face. She had CT of the head and face, non-contrast that was unremarkable. It also is revealed that she has some new right leg weakness. Medicine will follow-up including getting a brain MRI. The patient was demonstrated to have low-normal neutrophils at 1600, slight thrombocytopenia of 107 and a slight anemia with a hemoglobin 10.8, so hematology is consulted. No history of bleeding or bruising.   REVIEW OF SYSTEMS: Denies any headache, not currently dizzy, no chills or sweats. She is hard of hearing. She recalls that she has had prior strokes in the past. She describes a right leg weakness to me currently. She has no current exertional chest pain. No cough. No wheezing. No hemoptysis. No epistaxis. No abdominal pain. No nausea, vomiting or diarrhea. She has some stiffness waxing and waning in the back, but feels pretty good this morning. No other bone pain. No edema. No rash. No bruising. No pruritus. No dysuria or hematuria. She denies any change in bowel habits.   PAST MEDICAL HISTORY: Includes hypertension and a psychiatric evaluation for sundowning, confusion, acute psychosis interpreted as due to mild dementia. Multiple CVAs in the past as noted.   ALLERGIES: No known drug allergies.   MEDICATIONS: She has been on Norvasc, aspirin, Lipitor, Plavix, hydrochlorothiazide, metoprolol, pantoprazole and Diovan.   SOCIAL HISTORY: No alcohol. No tobacco. Her daughter is her healthcare power of attorney.   PHYSICAL EXAMINATION:  GENERAL: The patient  is alert and cooperative in no acute distress. She is oriented, cognitive is intact, as insight into her illness, knows about the heart going to fast or too slow and is not disoriented.  HEENT: Sclerae no jaundice. Mouth, no thrush.  NECK: No mass. No palpable lymph nodes in the neck, supraclavicular, submandibular or axilla.  LUNGS: Clear with decreased air entry. No wheezing or rales.  ABDOMEN: Nontender. No palpable mass or organomegaly.  EXTREMITIES: No edema.  NEUROLOGIC: The right leg is weaker in flexion at the hip and also subjectively she states the leg feels weaker and heavy. No rash. No edema. Some slight swelling in the upper lip.   LABORATORIES: Neutrophils are 1600, platelets are 107, hemoglobin is 10.8 and the MCV is 82. The creatinine is 1.0. Thyroid function is tested.   IMPRESSION AND PLAN: The patient with mild anemia compared labs in the system from February and back in 2008, hemoglobin is lower than prior. Platelets are lower than prior. No evidence of acute infection and no history of her recent viral syndrome. Differential diagnosis is extensive and includes early myelodysplastic syndrome, B12 deficiency, blood loss or iron deficiency causing the anemia, hypersplenism, although the exam was benign, hemolysis, which there is no evidence of suspicion currently, this is not the pattern of TTP or ITP, could always be multiple myeloma, early case. No history to suggest, although the patient has had transfusions in the distant past for knee surgeries, HIV or hepatitis C, which could always be ruled out, but is extremely unlikely.   PLAN: Would do a workup including rule out hemolysis, check stools, and get B12 and  iron studies and thyroid electrophoresis studies. HIV and hepatitis C. Depending on the results, it  might be later role for abdominal imaging or to check the erythropoietin level, not do currently. Serial CBC and medicine to follow other issues.      ____________________________ Simonne Come Inez Pilgrim, MD rgg:aw D: 10/19/2012 11:28:00 ET T: 10/20/2012 10:08:58 ET JOB#: [id number]  cc: Simonne Come. Inez Pilgrim, MD, <Dictator> Dallas Schimke MD ELECTRONICALLY SIGNED 12/05/2012 9:33

## 2015-02-25 NOTE — Consult Note (Signed)
PATIENT NAME:  Krystal Chavez, MONTERO MR#:  [id number] DATE OF BIRTH:  [birth year]  DATE OF CONSULTATION:  09/04/2013  REFERRING PHYSICIAN:  Dr. Governor Rooks. CONSULTING PHYSICIAN:  Lyann Hagstrom B. Aalina Brege, MD   REASON FOR CONSULTATION: To evaluate an agitated patient.   IDENTIFYING DATA: Krystal Chavez is an 81 year old female with no past psychiatric history.   CHIEF COMPLAINT: The patient unable to state.   HISTORY OF PRESENT ILLNESS: Krystal Chavez reportedly used to live independently until 6 weeks ago, at which time she was placed in a nursing facility. Per different sources, the patient was doing well at the facility up until recently when she suddenly became confused, believing that she is at her house, and agitated and threatening to other residents and staff. She was brought to the Emergency Room; however, she was in the Emergency Room on October 3rd, which would be during the time when she already was in a nursing home, if this is truly 6 weeks admission, where she had a head CT scan performed for deteriorating mental status. It is unknown to what degree the patient experienced cognitive decline and whether it was gradual or precipitous.  In the Emergency Room, the patient was, at some point, agitated and was arguing with the Emergency Room physician that she is at her house and does not wish to have our breakfast; she will make her own breakfast. Most of the time she was asleep, possibly from medications, and she was unable to provide any information. We did not find any evidence that she has any sort of infection. UTI was ruled out.  Apparently, there were no medication changes recently. The patient has been managed by the same physician who took care of her prior to transfer to a nursing facility.   PAST PSYCHIATRIC HISTORY: Apparently none.   FAMILY PSYCHIATRIC HISTORY: Unknown.   PAST MEDICAL HISTORY: Hypertension, dyslipidemia, constipation.   ALLERGIES: No known drug allergies.   MEDICATIONS  ON ADMISSION: Norvasc 20 mg daily, atorvastatin 20 mg daily, clopidogrel 75 mg daily, Colace 100 mg twice daily, FiberCon 625 mg daily, Seroquel 50 mg at bedtime.   SOCIAL HISTORY: As above, 6 weeks ago,  relocated from independent leading to nursing facility and experiencing cognitive and behavioral decline since.   REVIEW OF SYSTEMS: Impossible to obtain. The patient denies being in pain.   PHYSICAL EXAMINATION: VITAL SIGNS: Blood pressure 188/87, pulse 68, respirations 18, temperature 97.6.  GENERAL: This is a elderly African American female in no acute distress. The rest of the physical examination is deferred to her primary attending.   LABORATORY DATA: Chemistries are within normal limits except for BUN of 28, blood alcohol level is 0. LFTs within normal limits. TSH 1.77. Urine tox screen negative for substances. CBC with mild anemia. Urinalysis is not suggestive of urinary tract infection.   MENTAL STATUS EXAMINATION: The patient is examined in the Emergency Room. At the time of my assessment, she is asleep and difficult to awake. There were no really behavioral problems per nursing report prior.  She has been arguing with Emergency Room doctor, insisting that she is at home. She is very nicely dressed. Initially, she would not change into scrubs. She had nice necklace and nice blouse, looks clean and very pretty.     DIAGNOSIS: AXIS I: Cognitive disorder, not otherwise specified. Delirium of unknown cause.  AXIS II: Deferred.  AXIS III: Dyslipidemia, hypertension, constipation.  AXIS IV: Recent relocation, loss of way of life.  AXIS V: Global Assessment of  Functioning 35.   PLAN:   1.  I restarted all her medications as in the community.  2.  I Added low-dose Haldol for delirium.  3.  I added trazodone 50 mg 3 times daily for aggressive behavior. We will follow along.  4.  The patient may require transfer to geropsychiatry unit. In order to proceed with a referral, she needs EKG and  chest x-ray.  She will also have to be placed on involuntary commitment, in order for the referral to be accepted.    ____________________________ Ferdie Bakken B. Pama Roskos, MD jbp:dmm D: 09/04/2013 19:54:00 ET T: 09/04/2013 20:46:24 ET JOB#: [id number]  cc: Elesia Pemberton B. Jennet MaduroPucilowska, MD, <Dictator> Shari ProwsJOLANTA B Linc Renne MD ELECTRONICALLY SIGNED 09/06/2013 22:10

## 2015-02-26 NOTE — Consult Note (Signed)
General Aspect 81 y/o female w/ a h/o dementia, htn, orthostatic hypotn/syncope, and tia on chronic plavix whom we've been asked to eval r/t afib w/ rvr in the setting of unresponsiveness on admission.   Present Illness An 81 year old female with a prior h/o alzheimers dementia, htn, tia, syncope and orthostatic hypotn (10/2012).  She lives in [facility].  History obtained from previous records as pt is currently nonverbal and no family is around to obtain further details of history.  Per H & P, the patient is demented and sometimes not even able to identify her daughter, but still feeds herself and walks a little way to go to the bathroom, but mostly uses wheelchair. On 4/28, the nursing home team called daughter informing her that Ms. Simmering fell, but there were no acute injuries and staff planned to continue monitoring.  On 4/29, pt was apparently found to be unresponsive and EMS was called. When EMS arrived, the patient was responsive, but she was hypotensive.  H & P reports afib, however review of ECG suggests a jxnl rythm with pjc's.  She converted to sinus rhythm spontaneously without any intervention, and blood pressure became stable after that. The patient was somewhat lethargic, which responded to spontaneous conversion, and became a little bit more alert. Because of this episode, the patient was brought to the Emergency Room and found having positive UTI and severe dehydration with acute renal failure and was subsequently admitted to IM for further mgmt.  Following admission, she was initially bradycardic in a jxnl rhythm with hypotension.  Dopamine was ordered and she was transferred to ICU but BP improved.  Tele shows a period of bradycardia following transfer to ICU with evidence of high grade heart block but she later reverted to sinus in the 70's to 80's.  In the setting of acute illness, troponin has been found to be elevated @ 0.66.  There is no known prior h/o chest pain or complaints of  dyspnea prior to presentation.  ECG does not show acute st/t changes.   Physical Exam:  GEN well developed, Pt moans briefly to verbal stimuli and tries to open her eyes.   HEENT pink conjunctivae, dry oral mucosa   NECK supple  No masses  no bruits/jvd.   RESP normal resp effort  diminished breath sounds bilat - poor effort.   CARD Regular rate and rhythm  2/6 syst murmur heard throughout.   ABD denies tenderness  soft  normal BS   LYMPH negative neck   EXTR negative cyanosis/clubbing, negative edema   SKIN normal to palpation   NEURO moans to verbal stimuli.  tries to open eyes but does not.  does not follow commands.   PSYCH lethargic   Review of Systems:  ROS Pt not able to provide ROS   Medications/Allergies Reviewed Medications/Allergies reviewed   Family & Social History:  Family and Social History:  Family History Pt unable to provide FH.SOCIAL HISTORY: Significant for no smoking, no drinking, and has 2 adult children. The patient [facility] at [facility]. She smoked years ago in her 22s and 57s. Her mother lived until old age, but she was not aware about her father so it is hard to trace about father's history.   Social History Pt unable to provide SH.   Place of Living Nursing Home     Orthostatic Hypotension: a. 10/2012   syncope: a. 10/2012 Echo: EF >55%, diast dysfxn, mildly dil LA, mild MR.   Hyperlipidemia:  Alzheimer's Disease:    Left eye blindness:    Swallowing difficulty:    HOH:    Reflux:    Dementia:    Degenerative Disc Disease:    renal insufficiency:    anemia:    tia:    H/O stroke/ Right sided weakness:    Hypertension:    Hysterectomy:    Appendectomy:    Knee surgery:   Lab Results:  Routine Micro:  29-Apr-15 12:02   Micro Text Report URINE CULTURE   COMMENT                   NO GROWTH IN 8-12 HOURS   ANTIBIOTIC                       Specimen Source IN AND OUT CATH  Culture Comment NO GROWTH IN 8-12  HOURS  Result(s) reported on 04 Mar 2014 at 09:05AM.  Routine Chem:  30-Apr-15 05:38   Glucose, Serum  131  BUN  65  Creatinine (comp)  2.43  Sodium, Serum 143  Potassium, Serum  5.2  Chloride, Serum  116  CO2, Serum  16  Calcium (Total), Serum  7.4  Anion Gap 11  Osmolality (calc) 305  eGFR (African American)  20  eGFR (Non-African American)  17 (eGFR values <54m/min/1.73 m2 may be an indication of chronic kidney disease (CKD). Calculated eGFR is useful in patients with stable renal function. The eGFR calculation will not be reliable in acutely ill patients when serum creatinine is changing rapidly. It is not useful in  patients on dialysis. The eGFR calculation may not be applicable to patients at the low and high extremes of body sizes, pregnant women, and vegetarians.)  Cardiac:  29-Apr-15 11:54   Troponin I  0.66 (0.00-0.05 0.05 ng/mL or less: NEGATIVE  Repeat testing in 3-6 hrs  if clinically indicated. >0.05 ng/mL: POTENTIAL  MYOCARDIAL INJURY. Repeat  testing in 3-6 hrs if  clinically indicated. NOTE: An increase or decrease  of 30% or more on serial  testing suggests a  clinically important change)    16:49   CPK-MB, Serum  4.7 (Result(s) reported on 03 Mar 2014 at [facility])    19:42   CPK-MB, Serum  4.9 (Result(s) reported on 03 Mar 2014 at 08:16PM.)  30-Apr-15 13:10   Troponin I  1.70 (0.00-0.05 0.05 ng/mL or less: NEGATIVE  Repeat testing in 3-6 hrs  if clinically indicated. >0.05 ng/mL: POTENTIAL  MYOCARDIAL INJURY. Repeat  testing in 3-6 hrs if  clinically indicated. NOTE: An increase or decrease  of 30% or more on serial  testing suggests a  clinically important change)  Routine UA:  29-Apr-15 12:02   Color (UA) Yellow  Clarity (UA) Cloudy  Glucose (UA) 50 mg/dL  Bilirubin (UA) Negative  Ketones (UA) Negative  Specific Gravity (UA) 1.016  Blood (UA) 1+  pH (UA) 6.0  Protein (UA) 100 mg/dL  Nitrite (UA) Negative  Leukocyte Esterase (UA)  2+ (Result(s) reported on 03 Mar 2014 at 01:35PM.)  RBC (UA) 7 /HPF  WBC (UA) 23 /HPF  Bacteria (UA) 1+  Epithelial Cells (UA) 6 /HPF  Mucous (UA) PRESENT  Hyaline Cast (UA) 1 /LPF (Result(s) reported on 03 Mar 2014 at 01:35PM.)  Routine Hem:  30-Apr-15 05:38   WBC (CBC) 6.1  RBC (CBC) 3.95  Hemoglobin (CBC)  10.2  Hematocrit (CBC)  33.3  Platelet Count (CBC)  86  MCV 84  MCH  25.7  MCHC  30.6  RDW  16.0  Neutrophil % 73.6  Lymphocyte % 17.8  Monocyte % 8.3  Eosinophil % 0.1  Basophil % 0.2  Neutrophil # 4.5  Lymphocyte # 1.1  Monocyte # 0.5  Eosinophil # 0.0  Basophil # 0.0 (Result(s) reported on 04 Mar 2014 at [facility].)   EKG:  EKG Interp. by me   Interpretation sinus tach, 101, 1st deg avb, lvh, no acute st/t changes.   Radiology Results: XRay:    29-Apr-15 15:30, Lumbar Spine AP and Lateral  Lumbar Spine AP and Lateral   REASON FOR EXAM:    fall, pain  COMMENTS:       PROCEDURE: DXR - DXR LUMBAR SPINE AP AND LATERAL  - Mar 03 2014  3:30PM     CLINICAL DATA:  Fall.  Back pain.    EXAM:  LUMBAR SPINE - 2-3 VIEW    COMPARISON:  CT abdomen and pelvis 07/08/2013    FINDINGS:  There are 5 non rib-bearing lumbar type vertebral bodies. There is  approximately 11 mm of anterolisthesis of L5 on S1 with bilateral L5  pars defects, better demonstrated on prior CT. Vertebral body  heights are preserved without evidence of compression fracture.  Facet arthrosis is present in the lower lumbar spine. There is  severe disc space narrowing at L5-S1. Mild disc space narrowing and  endplate osteophyte formation are present at L2-3 with vacuum disc  phenomenon.     IMPRESSION:  1. No acute osseous abnormality identified in the lumbar spine.  2. Bilateral L5 pars defects with grade 2 anterolisthesis of L5 on  S1. Severe L5-S1 degenerative disc disease.      Electronically Signed    By: Logan Bores    On: 03/03/2014 15:40         Verified By: Ferol Luz,  M.D.,    [place]: Rash  NKDA: None  Vital Signs/Nurse's Notes: **Vital Signs.:   30-Apr-15 10:00  Vital Signs Type Routine  Pulse Pulse 82  Pulse source if not from Vital Sign Device per cardiac monitor  Respirations Respirations 15  Systolic BP Systolic BP 086  Diastolic BP (mmHg) Diastolic BP (mmHg) 53  Mean BP 82  Pulse Ox % Pulse Ox % 99  Oxygen Delivery Room Air/ 21 %  Pulse Ox Heart Rate 82  Telemetry pattern Cardiac Rhythm Normal sinus rhythm  *Intake and Output.:   Shift 30-Apr-15 07:00  Grand Totals Intake:  1199 Output:      Net:  1199 24 Hr.:  7619  IV (Primary)      In:  1199  Length of Stay Totals Intake:  1399 Output:      Net:  5093    Impression 1.  UTI:   Abx per IM.  2.  AMS:   in setting of above along with h/o dementia, dehydration  3.  Arrhythmias (? afib/jxnl rhythm/1st deg avb/chb):   EMS strips, admission ECG, and subsequent tele strips available in ICU and on chart reviewed.   No clear evidence of afib.  Initial EMS strips appear to show a junctional rhythm   Tele strips since admission confirm intermittent jxnl bradycardia with rates in the 40's at times.   Strips around 5:20 AM show evidence of  high grade heart block with a V rate of 44.   Since ~ 6a however, she has been in sinus rhythm with a first degree avb.   ----Would d/c bb (has not been given yet).  --- F/U  K now given mild hyperkalemia in the setting of acute renal failure and dehydration.   Place pacer pads with zoll @ bedside in case she brady's down and becomes hypotensive again.   If bradycardia/heart block recur after reversal of metabolic abnormalities, she would require EP eval and PPM placement.  3.  Elevated troponin:  in setting of uti/hypotension.  Demand ischemia Cont to cycle CE.  Consider therapeutic anticoagulation with heparin if troponins continue to rise.   Will check echo to eval EF and wall motion.  Both previously nl by echo in 10/2012.   Would plan  conservative rx ngviven she is noncommunicative, age, dmentia.   Add asa.  She is on chronic plavix in the setting of prior TIA.  No bb in the setting of bradycardia/heart block.  Consider statin if felt to be clinically appropriate.  4.  Acute renal failure:  Suspec t prerenal, possible ATN  In setting of UTI and hypotension.  ARB on hold.  UTI being treated.  Hydrate.   Electronic Signatures: Rogelia Mire (NP)  (Signed 30-Apr-15 13:32)  Authored: General Aspect/Present Illness, History and Physical Exam, Review of System, Family & Social History, Past Medical History, Home Medications, Labs, EKG , Radiology, Allergies, Vital Signs/Nurse's Notes, Impression/Plan Ida Rogue (MD)  (Signed 30-Apr-15 16:33)  Authored: History and Physical Exam, Family & Social History, Labs, Impression/Plan  Co-Signer: General Aspect/Present Illness, Home Medications, Allergies   Last Updated: 30-Apr-15 16:33 by Ida Rogue (MD)

## 2015-02-26 NOTE — Discharge Summary (Signed)
PATIENT NAME:  Krystal Chavez, Krystal Chavez MR#:  [id number] DATE OF BIRTH:  23-Oct-1924  DATE OF ADMISSION:  03/03/2014 DATE OF DISCHARGE:  03/08/2014  DISCHARGE DIAGNOSES: 1.  Acute renal failure.  2.  Urinary tract infection.  3.  Altered mental status. 4.  Dementia.  5.  Hypotension and heart block, advised not to give beta blocker.  6.  Hypertension.  7.  Elevated troponin.  8.  Acute coronary syndrome ruled out. Most likely the troponin was due to infection and renal failure.   CONDITION ON DISCHARGE: Stable.   DISCHARGE MEDICATIONS: 1.  Caduet 10 mg/10 mg oral tablet once a day.  2.  Plavix 75 mg oral tablet once a day.  3.  Protonix 40 mg oral delayed-release tablet once a day.  4.  Diovan 160 mg oral tablet once a day.  5.  Zyprexa 5 mg oral tablet 2 times a day.  6.  Vitamin D3 1000 international units oral tablet once a day.  7.  Acetaminophen 500 mg oral tablet 2 tablets 3 times a day.  8.  Docusate sodium 100 mg oral capsule 2 times a day as needed.  9.  Aspirin 81 mg once a day.   DIET ON DISCHARGE: Low-sodium. Diet consistency: Regular.   ACTIVITY: As tolerated.   TIMEFRAME TO FOLLOW-UP: Within 1 to 2 weeks. Advised to have routine follow-up with primary care physician.   HISTORY OF PRESENT ILLNESS: This is an 81 year old female who lives in [facility] who has some Alzheimer dementia. On presentation, the patient's daughter was in the room and history obtained from her. As per her, at baseline the patient is demented, sometimes even not able to identify her daughter, but still feeds herself and walks a little on her way to the bathroom but mostly uses wheelchair. In nursing home they called her the previous day of admission about fall, but told there is nothing urgent and nothing serious so they just kept monitoring. Then the next day, that was the day of admission, around 10:30 in the morning, they called daughter saying that the patient is unresponsive. When EMS came, the  patient was responsive but she had A-fib, having hypotension. A-fib got converted to sinus rhythm spontaneously without any intervention. Blood pressure became stable after that. She was somewhat lethargic and drowsy. She was brought to the Emergency Room and found positive for UTI and severe dehydration and acute renal failure, so admitted for further management of these issues.   HOSPITAL COURSE AND STAY:  1.  Altered mental status due to metabolic encephalopathy secondary to UTI. TSH was normal. The patient had some baseline dementia and after treating her UTI and dehydration the patient had significant improvement in mental status and so we were able to discharge her back to the nursing home facility.  2.  UTI. She was given IV Rocephin for 5 days in hospital. She had urine culture, but it did not grow anything so after 5 days we discontinued the therapy and we are discharging back to nursing home.  3.  Acute renal failure with hyperkalemia. This was present on admission. We held her Diovan, we gave some IV fluid and gradually renal function improved and became totally normal.  4.  Elevated troponin. This was present on admission. Most likely it was due to the acute renal failure and infection. Cardiology consult was called in and they suggested it to be due to stress and no further work-up. During hospital stay, the patient was found  to be hypotensive again with bradycardia. Heart rate was like around 44 and she was found having high grade AV block so transferred to stepdown unit. We held her beta blocker and she had gradual improvement in her heart rate and blood pressure also improved with that so we are advising to avoid beta blockers in future.  5.  Hypertension. During the episode of bradycardia, the patient became hypotensive so all blood pressure medications were held. Initially on presentation she had renal failure so Diovan was on hold anyway. Later on renal function became normal and the  patient's heart rate became stable. Blood pressure started coming up and it was rising up to 180s and 200s systolic. We started her back on Diovan, but not able to control it very well so added amlodipine also and finally it controlled blood pressure nicely. We are able to discharge her back to nursing home for overall weakness and dementia.   CONSULTATIONS IN HOSPITAL: Cardiology with Dr. Kirke CorinArida.  IMPORTANT DIAGNOSTIC DATA: On presentation, creatinine was 2.17. It went up to 2.42. It gradually came down to 0.89 on 2nd May. Troponin was 0.66, went up to 1.7 and came down to 1.6 later on. Hemoglobin remained stable, 10.5 and 10.2. Urine culture did not grow anything. Urinalysis was positive with 23 WBCs and 2+ leukocyte esterase.   Echocardiogram showed left ventricular ejection fraction 60% to 65%. Normal global left ventricular systolic function. Impaired relaxation.   Ultrasound of kidney was done for renal failure. It showed no hydronephrosis, small amount of ascites.   TOTAL TIME SPENT ON THIS DISCHARGE: 40 minutes   ____________________________ Krystal PigeonVaibhavkumar G. Elisabeth PigeonVachhani, MD vgv:sb D: 03/08/2014 14:55:15 ET T: 03/08/2014 15:35:03 ET JOB#: [id number]  cc: Krystal PigeonVaibhavkumar G. Elisabeth PigeonVachhani, MD, <Dictator> Altamese DillingVAIBHAVKUMAR Teale Goodgame MD ELECTRONICALLY SIGNED 03/15/2014 22:19

## 2015-02-26 NOTE — H&P (Signed)
PATIENT NAME:  Krystal Chavez, Krystal Chavez MR#:  [id number] DATE OF BIRTH:  19-Aug-1924  DATE OF ADMISSION:  03/03/2014  PRIMARY CARE PHYSICIAN: Kandyce Rud, MD  REFERRING EMERGENCY ROOM PHYSICIAN: Darien Ramus, MD  CHIEF COMPLAINT: Confusion and altered mental status.   HISTORY OF PRESENT ILLNESS: An 81 year old female who lives in [facility], has some Alzheimer dementia. History obtained from the patient's daughter, who is present in the room. As per her, the patient is demented and sometimes not even able to identify her daughter, but still feeds herself and walks a little way to go to the bathroom, but mostly uses wheelchair. Yesterday, the nursing home team called daughter informing her that she fell down over there, but nothing was emergency or serious and so they we will just continue monitoring, but today at around 10:30, she received a call again saying that the patient is unresponsive. They also called EMS and as per the EMS record, when EMS arrived, the patient was responsive, but she was in A. fib and having hypotension. Her A. fib got converted to normal sinus rhythm spontaneously without any intervention, and blood pressure became stable after that. The patient was somewhat lethargic or drowsy, which responded to spontaneous conversion, and became a little bit more alert. Because of this episode, the patient was brought to the Emergency Room and found having positive UTI and severe dehydration with acute renal failure, so given as admission to hospitalist team.   REVIEW OF SYSTEMS:    CONSTITUTIONAL: Negative for fever, fatigue, chills. Positive for generalized weakness.  EYES: No blurring, double vision, discharge or redness.  EARS, NOSE, THROAT: No tinnitus, ear pain or hearing loss.  RESPIRATORY: No cough, wheezing, hemoptysis or shortness of breath.  CARDIOVASCULAR: No chest pain, orthopnea, edema, arrhythmia or palpitations.  GASTROINTESTINAL: No nausea, vomiting, diarrhea or  abdominal pain.  GENITOURINARY: No dysuria, but as per the daughter it appears to be increased frequency. During my examination also, the patient was asking to use the bathroom even though she is wearing diapers.  NEUROLOGICAL: No numbness, but has generalized weakness. Had some low back pain yesterday, but it resolved now.  MUSCULOSKELETAL: Joints: No swelling or pain.  PSYCHIATRIC: No anxiety, insomnia, bipolar disorder.   PAST MEDICAL HISTORY: 1.  Hypertension. 2.  Gastroesophageal reflux disease.  3.  History of TIA.  4.  Left eye blindness.  5.  Alzheimer dementia.  6.  Chronic constipation.  7.  COPD.  8.  Vitamin D deficiency.  9.  Hyperlipidemia.  10.  History of dysphagia.   SOCIAL HISTORY: Significant for no smoking, no drinking, and has 2 adult children. The patient lives at [facility]. She smoked years ago in her 30s and 68s. Her mother lived until old age, but she was not aware about her father so it is hard to trace about father's history.   PAST SURGICAL HISTORY: Had hysterectomy and left foot surgery.   MEDICATIONS: At nursing home before admission:  1.  Zyprexa 5 mg oral tablet 2 times a day. 2.  Vitamin D3, 1000 international units once a day.  3.  Protonix 40 mg oral once a day.  4.  Diovan 160 mg oral tablet once a day.  5.  Clopidogrel 75 mg oral once a day.  6.  Caduet 10 mg/10 mg oral tablet once a day.  7.  Acetaminophen 500 mg oral tablet 2 tablets 3 times a day as needed.   PHYSICAL EXAMINATION: VITAL SIGNS: In the  ER, temperature 97.4. Pulse rate is 100. Blood pressure is 125/70. Pulse oximetry is 94 on room air.  GENERAL: The patient is alert but appears to be disoriented, is hard of hearing and is difficult to get history from her. HEENT: Head and neck atraumatic. Conjunctivae pink. Oral mucosa dry.  NECK: Supple. No JVD.  RESPIRATORY: Bilateral equal and clear air entry.  CARDIOVASCULAR: S1, S2 present. Systolic murmur heard. No abnormality.   ABDOMEN: Soft, nontender. Bowel sounds present. No organomegaly.  SKIN: No rashes.  EXTREMITIES: Legs: No edema.  NEUROLOGICAL: Power 4/5 in all 4 limbs. Left lower limb activity is decreased because of arthritis, and it is since long time in the hip joint.  PSYCHIATRIC: No anxiety. Appears to be comfortable at this time, but has some baseline dementia.  MUSCULOSKELETAL: Joints: No swelling or tenderness.   IMPORTANT LABORATORY RESULTS: Glucose 136. BUN is 60, creatinine 2.17, sodium 141; potassium is 4.3; chloride is 111; CO2 is 22. Calcium level is 8.3. Total protein 6.8, albumin 3.1, bilirubin 0.6, alkaline phosphate 79, SGOT 104, SGPT 86. Troponin is 0.66. WBC 5.8, hemoglobin 10.5, platelet count 115, and MCV is 83. Urinalysis is positive with 23 WBCs and 2+ leukocyte esterase with negative nitrite.   ASSESSMENT AND PLAN: This is an 81 year old female with a past history of hypertension, Alzheimer's, chronic obstructive pulmonary disease, chronic constipation, nursing home resident, who had a fall yesterday but not much injuries, and today was more lethargic so sent to Emergency Room, found having urinary tract infection and acute renal failure.  1.  Altered mental status due to metabolic encephalopathy secondary to urinary tract infection. Will treat underlying cause.  2.  Urinary tract infection: Will treat with Rocephin IV and get urine culture.  3.  Acute renal failure: This is present on admission. Will treat with IV fluids and monitor tomorrow. In the past, creatinine was totally normal on previous admission.  4.  Elevated troponin: Most likely due to acute renal failure and infection, but will continue monitoring.  5.  Episode of paroxysmal atrial fibrillation as per the EMS. It resolved spontaneously. That might be due to her dehydration. Will continue hydration currently and continue monitoring with telemetry.  6.  History of hypertension: Currently the patient is dehydrated and in  renal failure, so we will continue IV fluids and hold all hypertensive medications.   CODE STATUS: Full code. I discussed with patient's daughter, who is present in the room and healthcare power of attorney. She said that unless the patient is brain dead, we would like to have everything done for her. Plan discussed with her and explained to them about the requirement of physical therapy evaluation, as the patient had a fall yesterday. We will also get an x-ray of lumbar spine, as the patient had complained of lower back pain in the morning, but not now.   TOTAL TIME SPENT ON THIS ADMISSION: 50 minutes.    ____________________________ Hope PigeonVaibhavkumar G. Elisabeth PigeonVachhani, MD vgv:jcm D: 03/03/2014 15:40:42 ET T: 03/03/2014 16:29:20 ET JOB#: [id number]  cc: Hope PigeonVaibhavkumar G. Elisabeth PigeonVachhani, MD, <Dictator> Kandyce RudMarcus Babaoff, MD  Altamese DillingVAIBHAVKUMAR Ellise Kovack MD ELECTRONICALLY SIGNED 03/15/2014 22:19

## 2015-02-27 NOTE — Discharge Summary (Signed)
PATIENT NAME:  Krystal Chavez, Krystal Chavez MR#:  [id number] DATE OF BIRTH:  1924-04-14  DATE OF ADMISSION:  12/28/2011 DATE OF DISCHARGE:  01/01/2012  HOSPITAL COURSE: See dictated history and physical for details. This 81 year old woman was brought to the Emergency Room after an episode of getting confused, paranoid, and wandering around in the night. This was a newish behavior for the patient. Because of the potential dangerousness of her behavior, she was admitted to the hospital for observation. In the hospital she did not display any dangerous behavior. She continued to believe that she had been chased by some next-door neighbors who were trying to rape or molest her but she did not show signs of any paranoia or psychotic behavior in the hospital. Her affect was euthymic and calm. She was generally cooperative. She mostly stayed to herself but ate well and interacted somewhat. We spoke as a treatment team with her outpatient physician and a Child psychotherapistsocial worker from the [facility]. I also spoke with her daughter. Given that this was a single episode and not part of an obvious ongoing psychosis, I recommended that we not start antipsychotic medication. There is a risk of increased mortality with these medications in older people and if they are not necessary it is probably better not to use them. The patient was given a small dose of 100 mg of Neurontin p.r.n. for sleep at night. I do not believe she actually took it and she appeared to sleep pretty well in the hospital. Education was provided to the patient and the family about progressive dementia and how there may be sundowning delirium. If these episodes seem to be continuing or other behavior problems emerging, consideration should be given eventually to whether she should be at a higher level of supervision.   DISPOSITION: Discharge back to her living facility.   FOLLOW-UP: Follow-up with primary care doctor. No psychiatric specific follow-up needed.    DISCHARGE MEDICATIONS:  1. Neurontin 100 mg p.r.n. p.o. for sleep. 2. Norvasc 2.5 mg per day.  3. Aspirin 81 mg per day.  4. Lipitor 10 mg per day.  5. Plavix 75 mg per day.  6. Hydrochlorothiazide 25 mg per day.  7. Metoprolol extended-release 50 mg per day.  8. Pantoprazole 40 mg per day.  9. FiberCon 625 mg per day.  10. Diovan 320 mg per day.   All of these may be given as combined medications especially for some of the blood pressures as an outpatient.   LABORATORY, DIAGNOSTIC, AND RADIOLOGICAL DATA: EKG showed some sinus bradycardia with first degree AV block. Chest x-ray unremarkable. Head CT unremarkable. Drug screen negative. Urinalysis unremarkable. TSH normal. Alcohol undetectable. Chemistry panel unremarkable. CBC unremarkable.   MENTAL STATUS EXAM AT DISCHARGE: Older woman in no acute distress. Pleasant and cooperative. Good eye contact. Normal psychomotor activity. Speech is decreased in total amount but easy to understand. Affect smiling and appropriate. Mood stated as being okay. Thoughts are generally lucid and directed. No evidence of loosening of associations or delusional thinking. Some slowness in cognition. No evidence of hallucinations. Denies suicidal or homicidal ideation. Insight regarding this recent episode remains impaired.   DIAGNOSES PRINCIPLE AND PRIMARY:  AXIS I: Dementia, probably of Alzheimer's type or vascular type with behavior disturbance.   SECONDARY DIAGNOSES:  AXIS I: No further.   AXIS II: No diagnosis.   AXIS III:  1. Hypertension. 2. History of strokes.   AXIS IV: Mild to moderate chronic stress.   AXIS V: Functioning at time of  discharge 67.   ____________________________ Audery Amel, MD jtc:drc D: 01/04/2012 16:45:38 ET T: 01/05/2012 08:43:09 ET JOB#: [id number] cc: Audery Amel, MD, <Dictator> Audery Amel MD ELECTRONICALLY SIGNED 01/05/2012 9:55

## 2015-02-27 NOTE — H&P (Signed)
PATIENT NAME:  Krystal Chavez, Krystal Chavez MR#:  [id number] DATE OF BIRTH:  July 11, 1924  DATE OF ADMISSION:  12/28/2011  IDENTIFYING INFORMATION AND CHIEF COMPLAINT: This is an 81 year old woman brought into the emergency room by her daughter because of psychotic symptoms.   CHIEF COMPLAINT: "My daughter does not believe me."   HISTORY OF PRESENT ILLNESS: Information obtained from the patient and from the chart as well as speaking to the patient's daughter. Last night the patient reports she was at home at her apartment where she lives by herself when she began hearing sounds coming through the wall of her house. She thought that she heard voices coming from next door saying that they were going to get her or hurt her. Specifically, they were making sexually aggressive comments about her. The patient became frightened and got in her car and started driving off. She was then convinced that she was being followed by the next-door neighbors who were chasing her. Evidently she drove around town for quite some time before nearly running out of gas on the [employer]. [place] police then assisted her and called the daughter and assisted in bringing her to the emergency room. The patient has some awareness that her story is bizarre but she remains convinced that this really happened. She tells me that she had seen the same next-door neighbors waving guns around some time earlier and has been afraid of them. She reports that her mood has been fine. She admits that she has not been sleeping very well for the last few days. She does not know of any particular reason why. She does not report any other hallucinations or psychotic symptoms that have been going on recently other than those confined to this incident. She has not had any change in her usual medications. Has not had any new particular stress in her life. Daughter reports that this is a new thing in the last day or so that her mother has made these sort of  paranoid comments. She did not have a concern about a prior history of mental illness.   PAST PSYCHIATRIC HISTORY: The patient has no prior psychiatric history. She has never been in a psychiatric hospital, never been on any psychiatric medicine. She has no history of psychosis or suicide attempt. She has been identified in the past by primary care doctors and others as having some mild dementia.   MEDICAL HISTORY: Patient has high blood pressure and has had a couple of what have been called "light strokes" in the past. Otherwise has no significant ongoing medical problems. Apparently her high blood pressure has at times been somewhat difficult to control, necessitating the use of several medications.   SOCIAL HISTORY: The patient lives alone in an apartment complex designed for senior citizens. Daughter and patient both say that it is generally considered a secure and safe environment. It does not seem likely that the events the patient is describing would have actually happened. The patient has two children, both adopted, adults. The daughter lives nearby, is probably her closest regular contact. The patient has remained very independent, being able to drive herself regularly on errands. She goes to an exercise class several times a week.   MEDICATIONS: Some of her blood pressure medicines are taken in combination but in total, the list that I would have would include:  1. Norvasc 2.5 mg a day.  2. Aspirin 81 mg per day.  3. Lipitor 10 mg per day.  4. Plavix 75 mg a  day.  5. Hydrochlorothiazide 25 mg per day.  6. Metoprolol extended-release 50 mg per day.   7. Pantoprazole 40 mg per day.   8. FiberCon once a day.   9. Diovan 320 mg per day.   ALLERGIES:  She has no known drug allergies.   REVIEW OF SYSTEMS: The patient is complaining of no pain currently. No nausea. Not feeling particularly fatigued. Mood is feeling fine.  Not complaining of any acute psychotic symptoms or hallucinations.    MENTAL STATUS EXAMINATION:  Pleasant and engaging woman interviewed in the emergency room. She is awake, alert and oriented. Makes good eye contact. Speech is easy to understand. The patient is a little bit hard of hearing but with just a little bit raising of the voice easy to communicate with. Psychomotor activity is normal. Affect is euthymic and reactive. Actually calmer than you would expect under the circumstances.  Thoughts are generally lucid although she does continue to maintain the delusion about what happened last night. She denies that she has been hearing voices or any other noises or having any strange experiences in the emergency room. I did not do a full Mini-Mental Status Exam but the patient was oriented to place and time completely appropriately. She was able to recall two out of three objects at three minutes even though we had had quite a bit of conversation in between. She could name simple objects correctly. She had a little trouble repeating a normal phrase. She seems to have at least normal fund of knowledge. In short, does not seem to have a great deal of dementia, just possibly a little. Denies suicidal or homicidal ideation.   PHYSICAL EXAMINATION:   GENERAL: Patient is elderly, and her skin has multiple small keratomas of no significance, probably. No other acute lesions.   HEENT: Pupils are equal and reactive. Face is symmetric generally. There may be a little bit of droop on one side left over from an old stroke but not a remarkable degree. Strength is normal symmetrically in her arms and legs as are reflexes.   MUSCULOSKELETAL: Full range of motion at upper extremities, lower extremities, some decrease in range of motion at hips and especially in her left foot where she has had some surgery.   LUNGS: Clear to auscultation without wheezes.   HEART: Regular rate and rhythm with no extra sounds.   ABDOMEN: Soft, nontender, normal bowel sounds.   VITAL SIGNS: Blood  pressure is most currently 164/102 remaining high, respirations 18, pulse 64, temperature 97.3.   ASSESSMENT: This is an 81 year old woman who had an episode of psychotically driven behavior last night. Today she is not agitated and not having further acute psychotic symptoms. This is a new phenomenon for the patient although she has been identified as having a mild degree of dementia in the past. On interview today, she seems to have a slight degree of dementia, but not an extraordinary amount. There is no recent change in medication. Probably the most remarkable thing about her physical examination is that her blood pressure is staying still quite high.   LABORATORY DATA:  My review of the labs shows no significant problems on the blood work. The CAT scan does not show any sign of any acute lesions and actually does not even show a particularly strong evidence of any old strokes. I suspect that either the patient has had another transient ischemic attack or "light stroke" that may have resulted in the psychotic symptoms or because she has  not been sleeping well recently and does have a little bit of dementia and hearing loss she may be having some "sundowning" and getting confused and agitated at night. It would be extremely unusual to have a more functional psychotic disorder occur at this time. She does not appear to have a mood disorder. She does not have evidence of any other specific medical problem that has been identified. The urinalysis is normal.   The patient will be admitted to the hospital primarily for safety because she does live alone and for stabilization.   TREATMENT PLAN: Admit to the behavioral health unit. I have given her a p.r.n. dose of Neurontin if needed to help with sleep tonight. P.r.n. doses of low-dose risperidone hopefully we will not be needed for psychosis. Continue blood pressure medicine. I think I will go ahead and get a medical consult to check on her blood pressure.  Monitor behavior and thinking. We may need to discuss whether her current living situation continues to be appropriate prior to discharge.   DIAGNOSIS PRINCIPLE AND PRIMARY:  AXIS I: Psychosis, not otherwise specified.   SECONDARY DIAGNOSES:  AXIS I: Dementia, mild, probably Alzheimer's type versus vascular.  AXIS II: Deferred.  AXIS III: Hypertension, history of strokes.  AXIS IV: Mild to moderate chronic stress from living alone.  AXIS V: Functioning at time of evaluation 42.    ____________________________ Audery AmelJohn T. Alpha Chouinard, MD jtc:vtd D: 12/28/2011 18:29:40 ET T: 12/29/2011 06:04:38 ET JOB#: [id number]  cc: Audery AmelJohn T. Kia Varnadore, MD, <Dictator> Audery AmelJOHN T Nickoli Bagheri MD ELECTRONICALLY SIGNED 12/29/2011 10:29

## 2015-02-27 NOTE — Consult Note (Signed)
Brief Consult Note: Diagnosis: psychosis nos.   Patient was seen by consultant.   Recommend further assessment or treatment.   Orders entered.   Discussed with Attending MD.   Comments: Psychiatry: Patient seen. Spoke with daughter. Reviewed chart. Patient with episode of delusional agitation last night but otherwise no current symptoms. Calm. BP high but labs mostly normal. [facility] and [facility]ablization and review of proper outpt placement. Patient and family agreeable.Admission dictated.  Electronic Signatures: Audery Amellapacs, Johnmark Geiger T (MD)  (Signed [phone]-Feb-13 12:45)  Authored: Brief Consult Note   Last Updated: 22-Feb-13 12:45 by Audery Amellapacs, Lusine Corlett T (MD)

## 2015-07-07 DEATH — deceased
